# Patient Record
Sex: Female | Born: 1950 | Race: White | Hispanic: No | Marital: Married
Health system: Southern US, Community
[De-identification: ages and names within clinical notes are randomized; demographics above are authoritative.]

---

## 2014-04-06 ENCOUNTER — Inpatient Hospital Stay: Payer: Self-pay | Admitting: Surgery

## 2014-04-06 LAB — URINALYSIS, COMPLETE
Bilirubin,UR: NEGATIVE
Blood: NEGATIVE
Glucose,UR: NEGATIVE mg/dL (ref 0–75)
Leukocyte Esterase: NEGATIVE
NITRITE: NEGATIVE
PH: 7 (ref 4.5–8.0)
Protein: NEGATIVE
RBC,UR: 2 /HPF (ref 0–5)
Specific Gravity: 1.023 (ref 1.003–1.030)
Squamous Epithelial: NONE SEEN

## 2014-04-06 LAB — COMPREHENSIVE METABOLIC PANEL
ALT: 29 U/L (ref 12–78)
Albumin: 3.6 g/dL (ref 3.4–5.0)
Alkaline Phosphatase: 60 U/L
Anion Gap: 12 (ref 7–16)
BUN: 19 mg/dL — ABNORMAL HIGH (ref 7–18)
Bilirubin,Total: 0.5 mg/dL (ref 0.2–1.0)
CALCIUM: 9.2 mg/dL (ref 8.5–10.1)
CREATININE: 0.77 mg/dL (ref 0.60–1.30)
Chloride: 104 mmol/L (ref 98–107)
Co2: 22 mmol/L (ref 21–32)
EGFR (African American): >60
EGFR (Non-African Amer.): >60
GLUCOSE: 135 mg/dL — AB (ref 65–99)
Osmolality: 280 (ref 275–301)
Potassium: 3.6 mmol/L (ref 3.5–5.1)
SGOT(AST): 32 U/L (ref 15–37)
SODIUM: 138 mmol/L (ref 136–145)
TOTAL PROTEIN: 7.5 g/dL (ref 6.4–8.2)

## 2014-04-06 LAB — CBC
HCT: 41.5 % (ref 35.0–47.0)
HGB: 14 g/dL (ref 12.0–16.0)
MCH: 32.2 pg (ref 26.0–34.0)
MCHC: 33.7 g/dL (ref 32.0–36.0)
MCV: 95 fL (ref 80–100)
Platelet: 236 10*3/uL (ref 150–440)
RBC: 4.35 10*6/uL (ref 3.80–5.20)
RDW: 12.7 % (ref 11.5–14.5)
WBC: 10.8 10*3/uL (ref 3.6–11.0)

## 2014-04-06 LAB — LIPASE, BLOOD: Lipase: 118 U/L (ref 73–393)

## 2014-04-06 LAB — TROPONIN I: Troponin-I: <0.02 ng/mL

## 2014-04-07 LAB — BASIC METABOLIC PANEL
Anion Gap: 7 (ref 7–16)
BUN: 14 mg/dL (ref 7–18)
Calcium, Total: 8.7 mg/dL (ref 8.5–10.1)
Chloride: 109 mmol/L — ABNORMAL HIGH (ref 98–107)
Co2: 22 mmol/L (ref 21–32)
Creatinine: 0.8 mg/dL (ref 0.60–1.30)
EGFR (African American): >60
EGFR (Non-African Amer.): >60
GLUCOSE: 168 mg/dL — AB (ref 65–99)
Osmolality: 280 (ref 275–301)
Potassium: 3.3 mmol/L — ABNORMAL LOW (ref 3.5–5.1)
Sodium: 138 mmol/L (ref 136–145)

## 2014-04-07 LAB — CBC WITH DIFFERENTIAL/PLATELET
Basophil #: 0 10*3/uL (ref 0.0–0.1)
Basophil %: 0.1 %
EOS ABS: 0 10*3/uL (ref 0.0–0.7)
Eosinophil %: 0 %
HCT: 39.4 % (ref 35.0–47.0)
HGB: 13.1 g/dL (ref 12.0–16.0)
Lymphocyte #: 0.3 10*3/uL — ABNORMAL LOW (ref 1.0–3.6)
Lymphocyte %: 1.5 %
MCH: 31.2 pg (ref 26.0–34.0)
MCHC: 33.1 g/dL (ref 32.0–36.0)
MCV: 94 fL (ref 80–100)
Monocyte #: 0.6 x10 3/mm (ref 0.2–0.9)
Monocyte %: 3.5 %
NEUTROS PCT: 94.9 %
Neutrophil #: 17.2 10*3/uL — ABNORMAL HIGH (ref 1.4–6.5)
Platelet: 225 10*3/uL (ref 150–440)
RBC: 4.18 10*6/uL (ref 3.80–5.20)
RDW: 12.7 % (ref 11.5–14.5)
WBC: 18.1 10*3/uL — ABNORMAL HIGH (ref 3.6–11.0)

## 2014-04-08 LAB — BASIC METABOLIC PANEL
Anion Gap: 5 — ABNORMAL LOW (ref 7–16)
BUN: 12 mg/dL (ref 7–18)
CALCIUM: 8.8 mg/dL (ref 8.5–10.1)
Chloride: 106 mmol/L (ref 98–107)
Co2: 31 mmol/L (ref 21–32)
Creatinine: 0.68 mg/dL (ref 0.60–1.30)
EGFR (African American): >60
EGFR (Non-African Amer.): >60
Glucose: 140 mg/dL — ABNORMAL HIGH (ref 65–99)
Osmolality: 285 (ref 275–301)
Potassium: 3.7 mmol/L (ref 3.5–5.1)
Sodium: 142 mmol/L (ref 136–145)

## 2014-04-08 LAB — CBC WITH DIFFERENTIAL/PLATELET
BASOS ABS: 0 10*3/uL (ref 0.0–0.1)
Basophil %: 0.3 %
Eosinophil #: 0 10*3/uL (ref 0.0–0.7)
Eosinophil %: 0 %
HCT: 38.9 % (ref 35.0–47.0)
HGB: 13 g/dL (ref 12.0–16.0)
Lymphocyte #: 0.5 10*3/uL — ABNORMAL LOW (ref 1.0–3.6)
Lymphocyte %: 5 %
MCH: 31.8 pg (ref 26.0–34.0)
MCHC: 33.5 g/dL (ref 32.0–36.0)
MCV: 95 fL (ref 80–100)
Monocyte #: 1 x10 3/mm — ABNORMAL HIGH (ref 0.2–0.9)
Monocyte %: 9.7 %
NEUTROS PCT: 85 %
Neutrophil #: 8.9 10*3/uL — ABNORMAL HIGH (ref 1.4–6.5)
Platelet: 200 10*3/uL (ref 150–440)
RBC: 4.09 10*6/uL (ref 3.80–5.20)
RDW: 12.9 % (ref 11.5–14.5)
WBC: 10.5 10*3/uL (ref 3.6–11.0)

## 2014-04-09 LAB — BASIC METABOLIC PANEL
Anion Gap: 6 — ABNORMAL LOW (ref 7–16)
BUN: 10 mg/dL (ref 7–18)
Calcium, Total: 8.4 mg/dL — ABNORMAL LOW (ref 8.5–10.1)
Chloride: 107 mmol/L (ref 98–107)
Co2: 28 mmol/L (ref 21–32)
Creatinine: 0.79 mg/dL (ref 0.60–1.30)
EGFR (Non-African Amer.): >60
Glucose: 126 mg/dL — ABNORMAL HIGH (ref 65–99)
OSMOLALITY: 282 (ref 275–301)
Potassium: 3.6 mmol/L (ref 3.5–5.1)
SODIUM: 141 mmol/L (ref 136–145)

## 2014-04-09 LAB — CBC WITH DIFFERENTIAL/PLATELET
Basophil #: 0 10*3/uL (ref 0.0–0.1)
Basophil %: 0.3 %
EOS PCT: 0.2 %
Eosinophil #: 0 10*3/uL (ref 0.0–0.7)
HCT: 36 % (ref 35.0–47.0)
HGB: 12 g/dL (ref 12.0–16.0)
LYMPHS ABS: 0.7 10*3/uL — AB (ref 1.0–3.6)
Lymphocyte %: 9.4 %
MCH: 31.6 pg (ref 26.0–34.0)
MCHC: 33.4 g/dL (ref 32.0–36.0)
MCV: 95 fL (ref 80–100)
MONO ABS: 1 x10 3/mm — AB (ref 0.2–0.9)
MONOS PCT: 13.2 %
NEUTROS ABS: 6.1 10*3/uL (ref 1.4–6.5)
Neutrophil %: 76.9 %
Platelet: 191 10*3/uL (ref 150–440)
RBC: 3.81 10*6/uL (ref 3.80–5.20)
RDW: 12.8 % (ref 11.5–14.5)
WBC: 7.9 10*3/uL (ref 3.6–11.0)

## 2014-04-10 LAB — CBC WITH DIFFERENTIAL/PLATELET
BASOS ABS: 0 10*3/uL (ref 0.0–0.1)
BASOS PCT: 0.3 %
Eosinophil #: 0.1 10*3/uL (ref 0.0–0.7)
Eosinophil %: 0.8 %
HCT: 35.9 % (ref 35.0–47.0)
HGB: 12.2 g/dL (ref 12.0–16.0)
Lymphocyte #: 0.8 10*3/uL — ABNORMAL LOW (ref 1.0–3.6)
Lymphocyte %: 12.1 %
MCH: 32 pg (ref 26.0–34.0)
MCHC: 34 g/dL (ref 32.0–36.0)
MCV: 94 fL (ref 80–100)
Monocyte #: 0.9 x10 3/mm (ref 0.2–0.9)
Monocyte %: 13.7 %
Neutrophil #: 4.9 10*3/uL (ref 1.4–6.5)
Neutrophil %: 73.1 %
Platelet: 187 10*3/uL (ref 150–440)
RBC: 3.81 10*6/uL (ref 3.80–5.20)
RDW: 12.8 % (ref 11.5–14.5)
WBC: 6.7 10*3/uL (ref 3.6–11.0)

## 2014-04-10 LAB — BASIC METABOLIC PANEL
Anion Gap: 3 — ABNORMAL LOW (ref 7–16)
BUN: 5 mg/dL — ABNORMAL LOW (ref 7–18)
CHLORIDE: 107 mmol/L (ref 98–107)
Calcium, Total: 8.3 mg/dL — ABNORMAL LOW (ref 8.5–10.1)
Co2: 29 mmol/L (ref 21–32)
Creatinine: 0.72 mg/dL (ref 0.60–1.30)
EGFR (African American): >60
EGFR (Non-African Amer.): >60
GLUCOSE: 119 mg/dL — AB (ref 65–99)
Osmolality: 276 (ref 275–301)
POTASSIUM: 3 mmol/L — AB (ref 3.5–5.1)
SODIUM: 139 mmol/L (ref 136–145)

## 2014-04-11 LAB — BASIC METABOLIC PANEL
ANION GAP: 4 — AB (ref 7–16)
BUN: 8 mg/dL (ref 7–18)
CHLORIDE: 104 mmol/L (ref 98–107)
Calcium, Total: 8.5 mg/dL (ref 8.5–10.1)
Co2: 30 mmol/L (ref 21–32)
Creatinine: 0.73 mg/dL (ref 0.60–1.30)
Glucose: 128 mg/dL — ABNORMAL HIGH (ref 65–99)
Osmolality: 276 (ref 275–301)
Potassium: 3.2 mmol/L — ABNORMAL LOW (ref 3.5–5.1)
Sodium: 138 mmol/L (ref 136–145)

## 2014-04-14 LAB — CREATININE, SERUM: CREATININE: 0.55 mg/dL — AB (ref 0.60–1.30)

## 2015-02-28 NOTE — Discharge Summary (Signed)
PATIENT NAME:  Kristine Fleming, Kristine Fleming MR#:  [id number] DATE OF BIRTH:  1951-11-02  DATE OF ADMISSION:  04/06/2014 DATE OF DISCHARGE:  04/16/2014  DISCHARGE DIAGNOSES:  1.  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  2.  History of perforated appendicitis, status post appendectomy.  3.  Hypothyroidism.  4.  Hypercalcemia.  5.  Hyperparathyroidism.  6.  Anxiety.   PROCEDURE PERFORMED: Exploratory laparotomy and lysis of obstructing adhesion on April 10, 2014.   DISCHARGE MEDICATIONS:  As follows: Mirtazapine 3.75 p.o. at bedtime; l-thyroxine 75 mcg daily; aspirin 81 p.o. daily; fish oil 1 Fleming.i.d.; centrum Silver 1 tab p.o. daily; Fleming complex with B12 1 tab p.o. daily; vitamin D2 5000 units 1 p.o. q. month; Restasis 1 drop Fleming.i.d.; carteolol 1% ophthalmologic solution 1 drop daily; Refresh ophthalmologic solution 1 drop 5 times a day; acyclovir 500 p.o. daily; Norco 1 to 2 tabs p.o. q.6 hours p.r.n. pain and Simethicone 80 mg one q.8 hours p.r.n. gas pains.   INDICATION FOR ADMISSION: Kristine Fleming is a pleasant 64 year old, who presents with abdominal pain, nausea, vomiting and CT scan concerning for bowel obstruction. She was thus admitted for management of bowel obstruction.   HOSPITAL COURSE: As follows: Kristine Fleming was admitted. She was given IV fluids and NG tube. Although she did regain some bowel function, it did not go back to normal and her x-ray continued to look consistent with bowel obstruction. Therefore, she went to the operating room on June 04. Following this, she had an ileus, which eventually resolved. At the time of discharge, she was taking good p.o. with good p.o. pain control, was voiding and stooling without difficulties.   DISCHARGE INSTRUCTIONS: As follows: Kristine Fleming is to follow up in approximately 1 week. She is to call or return to the ED if she has increased pain, nausea, vomiting, redness, drainage from incision.  ____________________________ Si Raiderhristopher A. Lundquist,  MD cal:aw D: 04/24/2014 23:32:42 ET T: 04/25/2014 06:25:33 ET JOB#: [id number]  cc: Cristal Deerhristopher A. Lundquist, MD, <Dictator> Jarvis NewcomerHRISTOPHER A LUNDQUIST MD ELECTRONICALLY SIGNED 04/29/2014 10:53

## 2015-02-28 NOTE — Op Note (Signed)
PATIENT NAME:  Kristine FlakesHANEY, Kealy B MR#:  [id number] DATE OF BIRTH:  11/11/1950  DATE OF PROCEDURE:  04/10/2014  PREOPERATIVE DIAGNOSIS: Small bowel obstruction secondary to adhesions.   POSTOPERATIVE DIAGNOSIS: Small bowel obstruction secondary to adhesions and internal hernia.   ESTIMATED BLOOD LOSS: 20 mL.   SPECIMENS:  None.  INDICATIONS FOR SURGERY:  [name] is a pleasant 64 year old female who was admitted with intestinal obstruction that failed to improve with nasogastric decompression.  She was thus brought to the operating room suite for exploratory laparotomy and lysis of adhesions.   DETAILS OF PROCEDURE: As follows: Informed consent was obtained. [name] was brought to the operating room suite. She was induced. Endotracheal tube was placed. General anesthesia was administered. Her abdomen was then prepped and draped in a standard surgical fashion. A timeout was then performed correctly identifying the patient and the operative site  and procedure to be performed. A midline incision was made periumbilically.  The fascia was incised. The peritoneum was entered. There was a large amount of peritoneal fluid upon entry and multiple dilated loops of bowel. The small bowel was eviscerated and followed proximally to the ligament of Treitz, was followed distally down to the area of the terminal ileum where there was an adhesive band between a piece of bowel and the terminal ileum and the abdominal wall, under which there was a piece of bowel that was creating internal hernia that would not have likely reduced on its own in may estimation. After this band was lysed, the bowel appeared to the without obstruction. I did look at the cecum and there was a small area of serosal injury to the cecum. This was oversewn with 3-0 silk Lembert sutures. The abdomen was irrigated with normal saline. It was then examined again for hemostasis. When hemostasis was obtained, the wound was closed with running looped #1  PDS, which was run from one direction to the other, then 2 strands of the loop were tied together to ensure closure. The skin was then stapled. Sterile dressing was placed over the wound. The patient was then awoken, extubated and brought to the postanesthesia care unit. There were no immediate complications. Needle, sponge and instrument counts were correct at the end of the procedure.     ____________________________ Si Raiderhristopher A. Willia Lampert, MD cal:dmm D: 04/11/2014 11:32:00 ET T: 04/11/2014 11:55:28 ET JOB#: [id number]  cc: Cristal Deerhristopher A. Alexzander Dolinger, MD, <Dictator> Jarvis NewcomerHRISTOPHER A Mckenna Boruff MD ELECTRONICALLY SIGNED 04/12/2014 13:31

## 2015-02-28 NOTE — H&P (Signed)
Subjective/Chief Complaint abdominal pain and vomiting   History of Present Illness 64 y/o female with about 24 hours history of diffuse gradual onset abdominal pain followed by nausea and emesis on one occasion.  She has never had abdominal pain such as this.  10 years ago had perforated appendicitis.  No interval admissions for bowel obstruction.   Past History hypothyrodism hypercalcemia hyperparathyroidism.   Past Med/Surgical Hx:  hyperparathyroid.:   hypothyroidism:   depre:   anixety:   Appendectomy:   ALLERGIES:  Avelox: Hives   Other Allergies none   Family and Social History:  Family History Non-Contributory   Social History negative tobacco, positive ETOH, negative Illicit drugs   Place of Living Home   Review of Systems:  Subjective/Chief Complaint see above   Physical Exam:  GEN no acute distress, thin, temp 98.4 p 81 bp 128/67 BMI 19.5   HEENT pale conjunctivae, PERRL, hearing intact to voice   NECK supple   RESP normal resp effort  clear BS   CARD regular rate   ABD positive tenderness  soft  distended  mildly distended, faint mcburney's scar, no herniae present.   LYMPH negative neck   EXTR negative cyanosis/clubbing, negative edema   SKIN normal to palpation, No rashes   NEURO cranial nerves intact, negative rigidity   PSYCH A+O to time, place, person, good insight   Lab Results: Hepatic:  31-May-15 16:06   Bilirubin, Total 0.5  Alkaline Phosphatase 60 (45-117 NOTE: New Reference Range 09/27/13)  SGPT (ALT) 29  SGOT (AST) 32  Total Protein, Serum 7.5  Albumin, Serum 3.6  Routine Chem:  31-May-15 16:06   Lipase 118 (Result(s) reported on 06 Apr 2014 at 04:30PM.)  Glucose, Serum  135  BUN  19  Creatinine (comp) 0.77  Sodium, Serum 138  Potassium, Serum 3.6  Chloride, Serum 104  CO2, Serum 22  Calcium (Total), Serum 9.2  Osmolality (calc) 280  eGFR (African American) >60  eGFR (Non-African American) >60 (eGFR values  <19mL/min/1.73 m2 may be an indication of chronic kidney disease (CKD). Calculated eGFR is useful in patients with stable renal function. The eGFR calculation will not be reliable in acutely ill patients when serum creatinine is changing rapidly. It is not useful in  patients on dialysis. The eGFR calculation may not be applicable to patients at the low and high extremes of body sizes, pregnant women, and vegetarians.)  Result Comment POTASSIUM/AST - Slight hemolysis, interpret results with  - caution.  Result(s) reported on 06 Apr 2014 at 04:34PM.  Anion Gap 12  Cardiac:  31-May-15 16:06   Troponin I < 0.02 (0.00-0.05 0.05 ng/mL or less: NEGATIVE  Repeat testing in 3-6 hrs  if clinically indicated. >0.05 ng/mL: POTENTIAL  MYOCARDIAL INJURY. Repeat  testing in 3-6 hrs if  clinically indicated. NOTE: An increase or decrease  of 30% or more on serial  testing suggests a  clinically important change)  Routine UA:  31-May-15 15:25   Color (UA) Yellow  Clarity (UA) Cloudy  Glucose (UA) Negative  Bilirubin (UA) Negative  Ketones (UA) Trace  Specific Gravity (UA) 1.023  Blood (UA) Negative  pH (UA) 7.0  Protein (UA) Negative  Nitrite (UA) Negative  Leukocyte Esterase (UA) Negative (Result(s) reported on 06 Apr 2014 at 03:54PM.)  RBC (UA) 2 /HPF  WBC (UA) 3 /HPF  Bacteria (UA) TRACE  Epithelial Cells (UA) NONE SEEN  Mucous (UA) PRESENT  Amorphous Crystal (UA) PRESENT (Result(s) reported on 06 Apr 2014 at 03:54PM.)  Routine Hem:  31-May-15 16:06   WBC (CBC) 10.8  RBC (CBC) 4.35  Hemoglobin (CBC) 14.0  Hematocrit (CBC) 41.5  Platelet Count (CBC) 236 (Result(s) reported on 06 Apr 2014 at 04:21PM.)  MCV 95  MCH 32.2  MCHC 33.7  RDW 12.7   Radiology Results: LabUnknown:    31-May-15 18:34, CT Abdomen and Pelvis With Contrast  PACS Image  CT:  CT Abdomen and Pelvis With Contrast  REASON FOR EXAM:    (1) abdominal pain, More lower, vomiting; (2) same;      NOTE:  Nursing to [facility]  COMMENTS:   May transport without cardiac monitor    PROCEDURE: CT  - CT ABDOMEN / PELVIS  W  - Apr 06 2014  6:34PM     CLINICAL DATA:  Lower abdominal pain, vomiting.  Prior appendectomy.    EXAM:  CT ABDOMEN AND PELVIS WITH CONTRAST    TECHNIQUE:  Multidetector CT imaging of the abdomen and pelvis was performed  using the standard protocol following bolus administration of  intravenous contrast.  CONTRAST:  75 mL Isovue 300 IV    COMPARISON:  None.    FINDINGS:  Lung bases are clear.    Liver, spleen, pancreas, and adrenal glands are within normal  limits.    Gallbladder is unremarkable. No intrahepatic or extrahepatic ductal  dilatation.    5 mm cyst in the anterior interpolar right kidney (series 2/image  36). Left kidney is within normal limits. No hydronephrosis.  Multiple dilated loops of mid/distal small bowel, predominantly in  the pelvis and right lower quadrant, with abrupt focal transition  near the distal/terminal ileum (series 2/image 57). Terminal ileum  is decompressed (series 2/image 63). This appearance is compatible  with at least high grade partial small bowel obstruction. Associated  tethering of adjacent bowel loops (series 7/image 50) suggests  adhesions as the underlying etiology.    No evidence of abdominal aortic aneurysm.    Trace pelvic ascites.    No suspicious abdominopelvic lymphadenopathy.    Uterus and bilateral ovaries are unremarkable.  Bladderis within normal limits.    Mild degenerative changes at L5-S1.     IMPRESSION:  At least high-grade partial small bowel obstruction with focal  transition in the distal/terminal ileum, likely on the basis of  adhesions.      Electronically Signed  By: Julian Hy M.D.    On: 04/06/2014 18:43       Verified By: Julian Hy, M.D.,    Assessment/Admission Diagnosis 64 y/o with ASBO I personally reviewed CT scan and agree with the interpretation.    Plan admit, npo, ngt anti-emetics follow up 2 way in am to follow contrast. lovenox and protonix prophylaxis.   Electronic Signatures: Sherri Rad (MD)  (Signed [phone] 20:11)  Authored: CHIEF COMPLAINT and HISTORY, PAST MEDICAL/SURGIAL HISTORY, ALLERGIES, Other Allergies, FAMILY AND SOCIAL HISTORY, REVIEW OF SYSTEMS, PHYSICAL EXAM, LABS, Radiology, ASSESSMENT AND PLAN   Last Updated: 31-May-15 20:11 by Sherri Rad (MD)

## 2016-01-09 IMAGING — CR DG ABDOMEN 2V
1 series · 2 of 2 positions shown · non-contrast
Comparison: 04/09/2014

CLINICAL DATA: Followup small bowel obstruction.

EXAM:
ABDOMEN - 2 VIEW

[Series 5: x abdomen supine · 0.14mm/px · 2 of 2 slices shown]
[im 1/2]
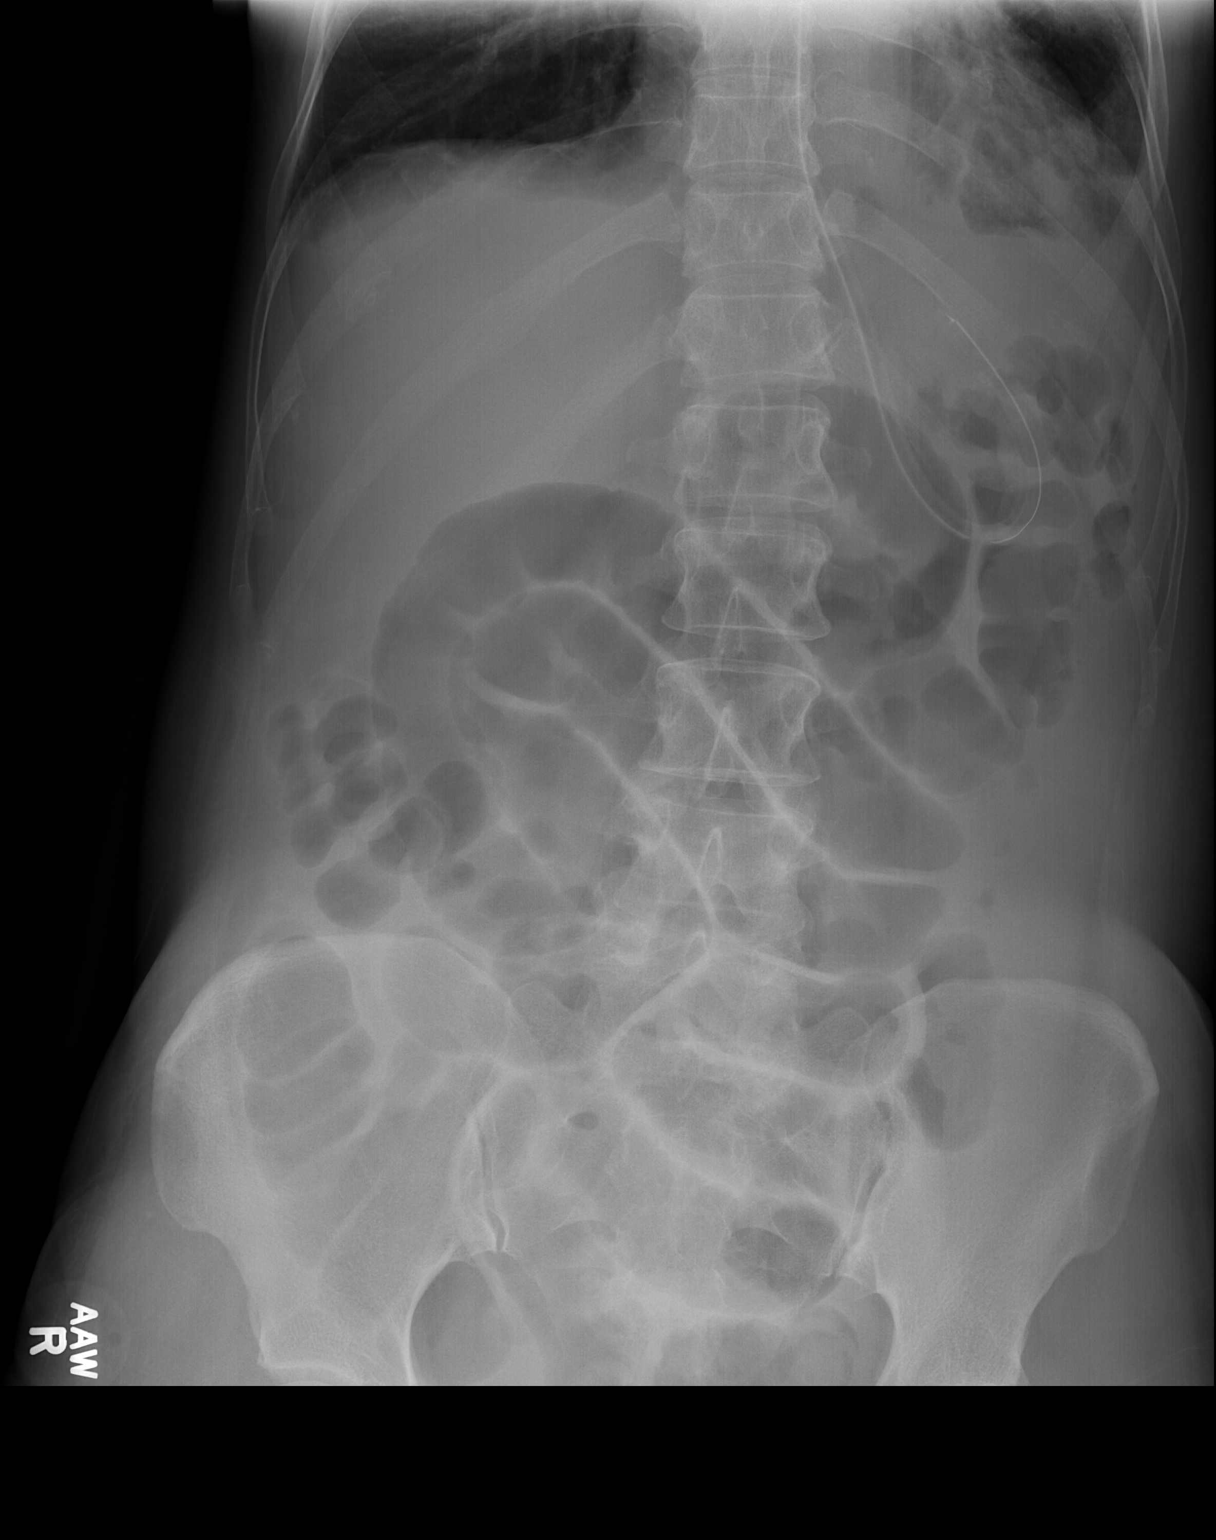
[im 2/2]
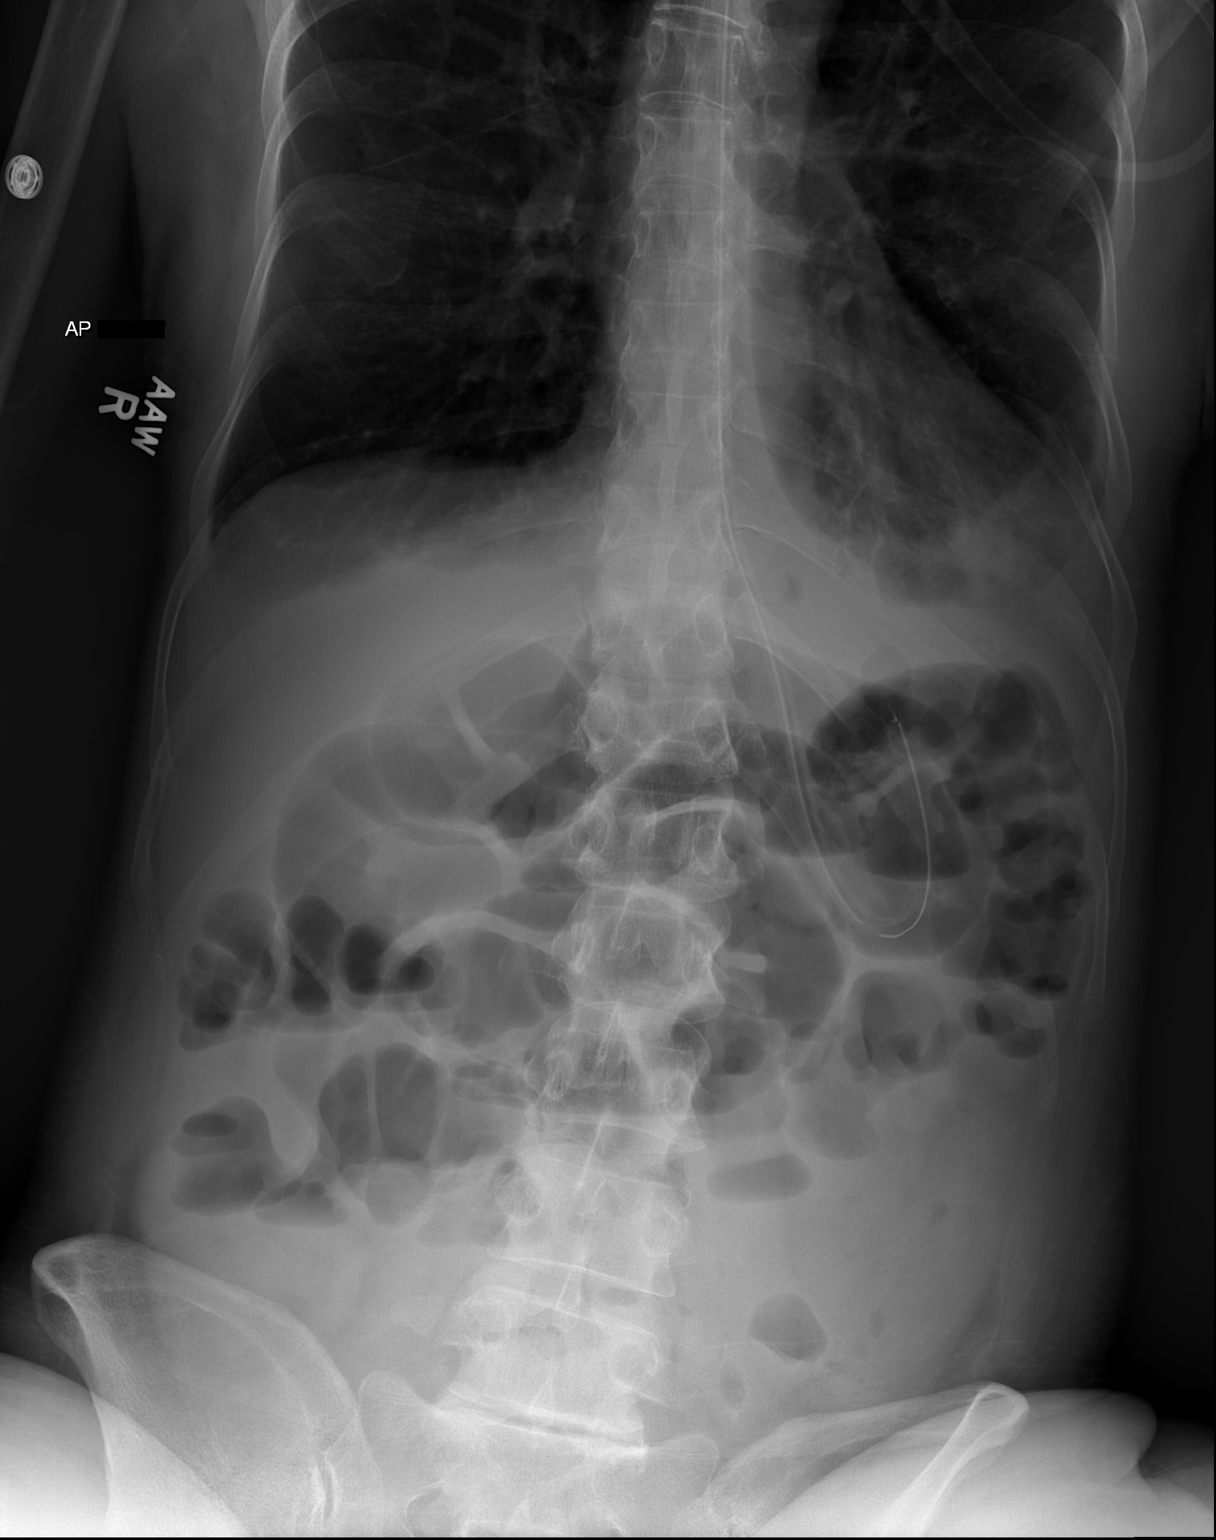

[2 of 2 positions shown; findings below may reference images not displayed]

FINDINGS: Nasogastric tube is in the stomach fundus region. There are
bibasilar chest densities suggestive for small effusions and concern
for patchy atelectasis or even airspace densities at the left lung
base. There continues to be dilated and gas-filled loops of small
bowel. The degree of small bowel distention has minimally changed.
There is some gas within the colon. No evidence to suggest free air.
IMPRESSION: Persistent dilated loops of small bowel. There is gas within the
colon. Findings are compatible with at least a partial small bowel
obstruction. The degree of small bowel distention has minimally
changed from 04/09/2014.

Nasogastric tube in the stomach.

Bibasilar chest densities are concerning for small pleural
effusions. Cannot exclude atelectasis or even airspace disease at
the left lung base.
